# Patient Record
Sex: Female | Born: 1975 | Race: White | Hispanic: No | Marital: Married | Smoking: Never smoker
Health system: Southern US, Community
[De-identification: ages and names within clinical notes are randomized; demographics above are authoritative.]

---

## 2006-01-11 ENCOUNTER — Observation Stay: Payer: Self-pay | Admitting: Obstetrics and Gynecology

## 2006-01-11 ENCOUNTER — Inpatient Hospital Stay: Payer: Self-pay | Admitting: Obstetrics and Gynecology

## 2007-01-27 ENCOUNTER — Ambulatory Visit: Payer: Self-pay | Admitting: Internal Medicine

## 2007-03-05 ENCOUNTER — Ambulatory Visit: Payer: Self-pay | Admitting: Internal Medicine

## 2008-08-14 ENCOUNTER — Ambulatory Visit: Payer: Self-pay | Admitting: Family Medicine

## 2010-08-30 ENCOUNTER — Ambulatory Visit: Payer: Self-pay

## 2011-08-10 ENCOUNTER — Ambulatory Visit: Payer: Self-pay | Admitting: Medical

## 2015-11-09 ENCOUNTER — Other Ambulatory Visit: Payer: Self-pay | Admitting: Family Medicine

## 2015-11-09 DIAGNOSIS — Z1231 Encounter for screening mammogram for malignant neoplasm of breast: Secondary | ICD-10-CM

## 2015-11-11 ENCOUNTER — Other Ambulatory Visit: Payer: Self-pay | Admitting: Family Medicine

## 2015-11-11 ENCOUNTER — Ambulatory Visit
Admission: RE | Admit: 2015-11-11 | Discharge: 2015-11-11 | Disposition: A | Discharge status: Home / Self Care | Payer: BC Managed Care – PPO | Source: Ambulatory Visit | Attending: Family Medicine | Admitting: Family Medicine

## 2015-11-11 DIAGNOSIS — Z1231 Encounter for screening mammogram for malignant neoplasm of breast: Secondary | ICD-10-CM | POA: Insufficient documentation

## 2016-09-29 ENCOUNTER — Other Ambulatory Visit: Payer: Self-pay | Admitting: Family Medicine

## 2016-09-29 DIAGNOSIS — Z1231 Encounter for screening mammogram for malignant neoplasm of breast: Secondary | ICD-10-CM

## 2016-11-21 ENCOUNTER — Ambulatory Visit
Admission: RE | Admit: 2016-11-21 | Discharge: 2016-11-21 | Disposition: A | Discharge status: Home / Self Care | Payer: BC Managed Care – PPO | Source: Ambulatory Visit | Attending: Family Medicine | Admitting: Family Medicine

## 2016-11-21 DIAGNOSIS — R928 Other abnormal and inconclusive findings on diagnostic imaging of breast: Secondary | ICD-10-CM | POA: Diagnosis not present

## 2016-11-21 DIAGNOSIS — Z1231 Encounter for screening mammogram for malignant neoplasm of breast: Secondary | ICD-10-CM | POA: Insufficient documentation

## 2016-11-28 ENCOUNTER — Other Ambulatory Visit: Payer: Self-pay | Admitting: Family Medicine

## 2016-11-28 DIAGNOSIS — N6320 Unspecified lump in the left breast, unspecified quadrant: Secondary | ICD-10-CM

## 2016-11-28 DIAGNOSIS — R928 Other abnormal and inconclusive findings on diagnostic imaging of breast: Secondary | ICD-10-CM

## 2016-11-30 ENCOUNTER — Ambulatory Visit
Admission: RE | Admit: 2016-11-30 | Discharge: 2016-11-30 | Disposition: A | Discharge status: Home / Self Care | Payer: BC Managed Care – PPO | Source: Ambulatory Visit | Attending: Family Medicine | Admitting: Family Medicine

## 2016-11-30 DIAGNOSIS — N6489 Other specified disorders of breast: Secondary | ICD-10-CM | POA: Diagnosis not present

## 2016-11-30 DIAGNOSIS — R928 Other abnormal and inconclusive findings on diagnostic imaging of breast: Secondary | ICD-10-CM | POA: Insufficient documentation

## 2016-11-30 DIAGNOSIS — N6321 Unspecified lump in the left breast, upper outer quadrant: Secondary | ICD-10-CM | POA: Insufficient documentation

## 2016-11-30 DIAGNOSIS — N6320 Unspecified lump in the left breast, unspecified quadrant: Secondary | ICD-10-CM

## 2016-12-06 ENCOUNTER — Other Ambulatory Visit: Payer: Self-pay | Admitting: Family Medicine

## 2016-12-06 DIAGNOSIS — R928 Other abnormal and inconclusive findings on diagnostic imaging of breast: Secondary | ICD-10-CM

## 2016-12-07 ENCOUNTER — Ambulatory Visit: Payer: BC Managed Care – PPO

## 2016-12-07 ENCOUNTER — Other Ambulatory Visit: Payer: BC Managed Care – PPO

## 2017-06-04 ENCOUNTER — Ambulatory Visit
Admission: RE | Admit: 2017-06-04 | Discharge: 2017-06-04 | Disposition: A | Discharge status: Home / Self Care | Payer: BC Managed Care – PPO | Source: Ambulatory Visit | Attending: Family Medicine | Admitting: Family Medicine

## 2017-06-04 DIAGNOSIS — R928 Other abnormal and inconclusive findings on diagnostic imaging of breast: Secondary | ICD-10-CM | POA: Diagnosis not present

## 2017-06-05 ENCOUNTER — Other Ambulatory Visit: Payer: Self-pay | Admitting: Family Medicine

## 2017-06-05 DIAGNOSIS — N6489 Other specified disorders of breast: Secondary | ICD-10-CM

## 2017-06-05 DIAGNOSIS — R928 Other abnormal and inconclusive findings on diagnostic imaging of breast: Secondary | ICD-10-CM

## 2017-06-12 ENCOUNTER — Ambulatory Visit
Admission: RE | Admit: 2017-06-12 | Discharge: 2017-06-12 | Disposition: A | Discharge status: Home / Self Care | Payer: BC Managed Care – PPO | Source: Ambulatory Visit | Attending: Family Medicine | Admitting: Family Medicine

## 2017-06-12 DIAGNOSIS — R928 Other abnormal and inconclusive findings on diagnostic imaging of breast: Secondary | ICD-10-CM | POA: Insufficient documentation

## 2017-06-12 DIAGNOSIS — N6489 Other specified disorders of breast: Secondary | ICD-10-CM

## 2017-06-12 DIAGNOSIS — N6082 Other benign mammary dysplasias of left breast: Secondary | ICD-10-CM | POA: Insufficient documentation

## 2017-06-12 HISTORY — PX: BREAST BIOPSY: SHX20

## 2017-06-13 LAB — SURGICAL PATHOLOGY

## 2018-02-11 ENCOUNTER — Ambulatory Visit
Admission: EM | Admit: 2018-02-11 | Discharge: 2018-02-11 | Disposition: A | Discharge status: Home / Self Care | Payer: BC Managed Care – PPO | Attending: Family Medicine | Admitting: Family Medicine

## 2018-02-11 ENCOUNTER — Encounter: Payer: Self-pay | Admitting: Emergency Medicine

## 2018-02-11 ENCOUNTER — Other Ambulatory Visit: Payer: Self-pay

## 2018-02-11 DIAGNOSIS — R319 Hematuria, unspecified: Secondary | ICD-10-CM

## 2018-02-11 DIAGNOSIS — B9689 Other specified bacterial agents as the cause of diseases classified elsewhere: Secondary | ICD-10-CM

## 2018-02-11 DIAGNOSIS — N390 Urinary tract infection, site not specified: Secondary | ICD-10-CM | POA: Diagnosis not present

## 2018-02-11 LAB — URINALYSIS, COMPLETE (UACMP) WITH MICROSCOPIC
Bilirubin Urine: NEGATIVE
GLUCOSE, UA: NEGATIVE mg/dL
KETONES UR: NEGATIVE mg/dL
Nitrite: NEGATIVE
PROTEIN: 100 mg/dL — AB
Specific Gravity, Urine: 1.015 (ref 1.005–1.030)
pH: 8.5 — ABNORMAL HIGH (ref 5.0–8.0)

## 2018-02-11 MED ORDER — CEPHALEXIN 500 MG PO CAPS: 500.0000 mg | ORAL_CAPSULE | Freq: Two times a day (BID) | ORAL | 0 refills | Status: AC

## 2018-02-11 NOTE — Discharge Instructions (Addendum)
Take medication as prescribed. Rest. Drink plenty of fluids.  ° °Follow up with your primary care physician this week as needed. Return to Urgent care for new or worsening concerns.  ° °

## 2018-02-11 NOTE — ED Triage Notes (Signed)
Patient c/o urinary urgency, dysuria and frequency that started last night.

## 2018-02-11 NOTE — ED Provider Notes (Signed)
[facility] ____________________________________________  Time seen: Approximately 8:59 AM  I have reviewed the triage vital signs and the nursing notes.   HISTORY  Chief Complaint Urinary Urgency and Dysuria   HPI Cheryl Simmons is a 42 y.o. female feeling for evaluation of urinary frequency, urinary urgency and some burning with urination that started last night.  States symptoms worsened as the night progressed, including more frequent urination and onset of burning with urination.  States some lower abdominal pressure discomfort but denies any other abdominal pain.  Denies atypical back pain.  Denies accompanying fevers, cough, congestion, flank pain, chest pain, shortness of breath, vaginal discharge, vaginal pain.  Denies current pregnancy.  Did take over-the-counter ibuprofen and use a heating pad which helps some.  Denies other alleviating measures.  Denies aggravating factors.  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  Reports otherwise doing well denies other complaints.  No recent sickness.  White, Arlyss Repress, NP PCP   History reviewed. No pertinent past medical history. Denies  There are no active problems to display for this patient.   Past Surgical History:  Procedure Laterality Date  . BREAST BIOPSY Left 06/12/2017   path pending     No current facility-administered medications for this encounter.   Current Outpatient Medications:  .  cephALEXin (KEFLEX) 500 MG capsule, Take 1 capsule (500 mg total) by mouth 2 (two) times daily for 7 days., Disp: 14 capsule, Rfl: 0  Allergies Patient has no known allergies.  Family History  Problem Relation Age of Onset  . Breast cancer Paternal Grandmother 29    Social History Social History   Tobacco Use  . Smoking status: Never Smoker  . Smokeless tobacco: Never Used  Substance Use Topics  . Alcohol use: Yes    Comment: socially  .  Drug use: Never    Review of Systems Constitutional: No fever Cardiovascular: Denies chest pain. Respiratory: Denies shortness of breath. Gastrointestinal: No abdominal pain.  No nausea, no vomiting.  No diarrhea.   Genitourinary: positive for dysuria. Musculoskeletal: Negative for back pain. Skin: Negative for rash.   ____________________________________________   PHYSICAL EXAM:  VITAL SIGNS: ED Triage Vitals  Enc Vitals Group     BP 02/11/18 0811 98/65     Pulse Rate 02/11/18 0811 89     Resp 02/11/18 0811 18     Temp 02/11/18 0811 98.4 F (36.9 C)     Temp Source 02/11/18 0811 Oral     SpO2 02/11/18 0811 100 %     Weight 02/11/18 0809 120 lb (54.4 kg)     Height 02/11/18 0809 5\' 5"  (1.651 m)     Head Circumference --      Peak Flow --      Pain Score 02/11/18 0809 5     Pain Loc --      Pain Edu? --      Excl. in [place]? --     Constitutional: Alert and oriented. Well appearing and in no acute distress. ENT      Head: Normocephalic and atraumatic. Cardiovascular: Normal rate, regular rhythm. Grossly normal heart sounds.  Good peripheral circulation. Respiratory: Normal respiratory effort without tachypnea nor retractions. Breath sounds are clear and equal bilaterally. No wheezes, rales, rhonchi. Gastrointestinal: Minimal midline suprapubic tenderness.  Abdomen otherwise soft non-tender.  No CVA tenderness. Musculoskeletal: No midline cervical, thoracic or lumbar tenderness to palpation.  Neurologic:  Normal speech  and language. Speech is normal. No gait instability.  Skin:  Skin is warm, dry  Psychiatric: Mood and affect are normal. Speech and behavior are normal. Patient exhibits appropriate insight and judgment   ___________________________________________   LABS (all labs ordered are listed, but only abnormal results are displayed)  Labs Reviewed  URINALYSIS, COMPLETE (UACMP) WITH MICROSCOPIC - Abnormal; Notable for the following components:      Result Value    APPearance HAZY (*)    pH 8.5 (*)    Hgb urine dipstick LARGE (*)    Protein, ur 100 (*)    Leukocytes, UA MODERATE (*)    Bacteria, UA FEW (*)    All other components within normal limits    PROCEDURES Procedures    INITIAL IMPRESSION / ASSESSMENT AND PLAN / ED COURSE  Pertinent labs & imaging results that were available during my care of the patient were reviewed by me and considered in my medical decision making (see chart for details).  Well-appearing patient.  Urinalysis reviewed, suspect UTI.  Will treat with oral Keflex.  Encourage rest, fluids, supportive care.Discussed indication, risks and benefits of medications with patient.  Discussed follow up with Primary care physician this week. Discussed follow up and return parameters including no resolution or any worsening concerns. Patient verbalized understanding and agreed to plan.   ____________________________________________   FINAL CLINICAL IMPRESSION(S) / ED DIAGNOSES  Final diagnoses:  Urinary tract infection with hematuria, site unspecified     ED Discharge Orders         Ordered    cephALEXin (KEFLEX) 500 MG capsule  2 times daily     02/11/18 0837           Note: This dictation was prepared with Dragon dictation along with smaller phrase technology. Any transcriptional errors that result from this process are unintentional.         Renford Dills, NP 02/11/18 [phone]

## 2018-05-06 ENCOUNTER — Other Ambulatory Visit: Payer: Self-pay | Admitting: Family Medicine

## 2018-05-06 DIAGNOSIS — Z1231 Encounter for screening mammogram for malignant neoplasm of breast: Secondary | ICD-10-CM

## 2018-05-21 ENCOUNTER — Ambulatory Visit
Admission: RE | Admit: 2018-05-21 | Discharge: 2018-05-21 | Disposition: A | Discharge status: Home / Self Care | Payer: BC Managed Care – PPO | Source: Ambulatory Visit | Attending: Family Medicine | Admitting: Family Medicine

## 2018-05-21 DIAGNOSIS — Z1231 Encounter for screening mammogram for malignant neoplasm of breast: Secondary | ICD-10-CM | POA: Insufficient documentation

## 2018-10-06 ENCOUNTER — Ambulatory Visit
Admission: EM | Admit: 2018-10-06 | Discharge: 2018-10-06 | Disposition: A | Discharge status: Home / Self Care | Payer: BC Managed Care – PPO | Attending: Family Medicine | Admitting: Family Medicine

## 2018-10-06 ENCOUNTER — Other Ambulatory Visit: Payer: Self-pay

## 2018-10-06 DIAGNOSIS — R319 Hematuria, unspecified: Secondary | ICD-10-CM

## 2018-10-06 DIAGNOSIS — N390 Urinary tract infection, site not specified: Secondary | ICD-10-CM

## 2018-10-06 DIAGNOSIS — R350 Frequency of micturition: Secondary | ICD-10-CM | POA: Diagnosis not present

## 2018-10-06 LAB — URINALYSIS, COMPLETE (UACMP) WITH MICROSCOPIC
Bilirubin Urine: NEGATIVE
Glucose, UA: NEGATIVE mg/dL
Ketones, ur: NEGATIVE mg/dL
Nitrite: NEGATIVE
Protein, ur: NEGATIVE mg/dL
Specific Gravity, Urine: 1.015 (ref 1.005–1.030)
WBC, UA: >50 WBC/hpf (ref 0–5)
pH: 7 (ref 5.0–8.0)

## 2018-10-06 MED ORDER — CEPHALEXIN 500 MG PO CAPS: 500.0000 mg | ORAL_CAPSULE | Freq: Two times a day (BID) | ORAL | 0 refills | Status: AC

## 2018-10-06 NOTE — ED Provider Notes (Signed)
[facility] ____________________________________________  Time seen: Approximately 1:34 PM  I have reviewed the triage vital signs and the nursing notes.   HISTORY  Chief Complaint Urinary Tract Infection (appt)   HPI Cheryl Simmons is a 43 y.o. female presenting for evaluation of urinary frequency, urinary urgency and burning with urination since last night.  Also noticed some blood in her urine.  Similar to previous UTI.  Suspects related to not drinking as much water recently as well as recent sexual intercourse.  Denies eat and drink well.  Denies recent cough, chest pain or shortness of breath or fevers.  Denies aggravating or alleviating factors.  No recent antibiotic use.  Patient's last menstrual period was 09/17/2018 (exact date).   History reviewed. No pertinent past medical history.  There are no active problems to display for this patient.   Past Surgical History:  Procedure Laterality Date  . BREAST BIOPSY Left 06/12/2017   bx/clip-neg     No current facility-administered medications for this encounter.   Current Outpatient Medications:  .  cephALEXin (KEFLEX) 500 MG capsule, Take 1 capsule (500 mg total) by mouth 2 (two) times daily for 7 days., Disp: 14 capsule, Rfl: 0  Allergies Patient has no known allergies.  Family History  Problem Relation Age of Onset  . Breast cancer Paternal Grandmother 89    Social History Social History   Tobacco Use  . Smoking status: Never Smoker  . Smokeless tobacco: Never Used  Substance Use Topics  . Alcohol use: Yes    Comment: socially  . Drug use: Never    Review of Systems Constitutional: No fever ENT: No sore throat. Cardiovascular: Denies chest pain. Respiratory: Denies shortness of breath. Gastrointestinal: No abdominal pain.  No nausea, no vomiting.  No diarrhea.   Genitourinary: positive for dysuria. Musculoskeletal: Negative for back pain. Skin: Negative for rash.    ____________________________________________   PHYSICAL EXAM:  VITAL SIGNS: ED Triage Vitals  Enc Vitals Group     BP 10/06/18 1221 102/76     Pulse Rate 10/06/18 1221 81     Resp 10/06/18 1221 18     Temp 10/06/18 1221 98.7 F (37.1 C)     Temp Source 10/06/18 1221 Oral     SpO2 10/06/18 1221 100 %     Weight 10/06/18 1222 125 lb (56.7 kg)     Height 10/06/18 1222 5\' 5"  (1.651 m)     Head Circumference --      Peak Flow --      Pain Score 10/06/18 1222 0     Pain Loc --      Pain Edu? --      Excl. in [place]? --     Constitutional: Alert and oriented. Well appearing and in no acute distress. ENT      Head: Normocephalic and atraumatic. Cardiovascular: Normal rate, regular rhythm. Grossly normal heart sounds.  Good peripheral circulation. Respiratory: Normal respiratory effort without tachypnea nor retractions. Breath sounds are clear and equal bilaterally. No wheezes, rales, rhonchi. Gastrointestinal: Soft and nontender. No CVA tenderness. Musculoskeletal: No midline cervical, thoracic or lumbar tenderness to palpation. Neurologic:  Normal speech and language.Speech is normal. No gait instability.  Skin:  Skin is warm, dry and intact. No rash noted. Psychiatric: Mood and affect are normal. Speech and behavior are normal. Patient exhibits appropriate insight and judgment   ___________________________________________   LABS (all labs ordered are listed, but only abnormal results are displayed)  Labs Reviewed  URINALYSIS,  COMPLETE (UACMP) WITH MICROSCOPIC - Abnormal; Notable for the following components:      Result Value   APPearance CLOUDY (*)    Hgb urine dipstick LARGE (*)    Leukocytes,Ua LARGE (*)    Bacteria, UA MANY (*)    All other components within normal limits  URINE CULTURE   ____________________________________________   PROCEDURES Procedures   INITIAL IMPRESSION / ASSESSMENT AND PLAN / ED COURSE  Pertinent labs & imaging results that were available  during my care of the patient were reviewed by me and considered in my medical decision making (see chart for details).  Well-appearing patient.  No acute distress.  Urinalysis reviewed, suspect UTI.  We will culture.  Will treat with oral Keflex.  Encourage rest, fluids, supportive care.Discussed indication, risks and benefits of medications with patient.  Discussed follow up with Primary care physician this week. Discussed follow up and return parameters including no resolution or any worsening concerns. Patient verbalized understanding and agreed to plan.   ____________________________________________   FINAL CLINICAL IMPRESSION(S) / ED DIAGNOSES  Final diagnoses:  Urinary tract infection with hematuria, site unspecified     ED Discharge Orders         Ordered    cephALEXin (KEFLEX) 500 MG capsule  2 times daily     10/06/18 1257           Note: This dictation was prepared with Dragon dictation along with smaller phrase technology. Any transcriptional errors that result from this process are unintentional.         Renford DillsMiller, Raja Caputi, NP 10/06/18 1341

## 2018-10-06 NOTE — ED Triage Notes (Signed)
Pt here for UTI with dysuria, frequency and some blood noticed. Started last night. Did take ibuprofen last night.

## 2018-10-06 NOTE — Discharge Instructions (Addendum)
Take medication as prescribed. Rest. Drink plenty of fluids.  ° °Follow up with your primary care physician this week as needed. Return to Urgent care for new or worsening concerns.  ° °

## 2018-10-08 LAB — URINE CULTURE: Culture: >=100000 — AB

## 2018-10-11 ENCOUNTER — Telehealth (HOSPITAL_COMMUNITY): Payer: Self-pay | Admitting: Emergency Medicine

## 2018-10-11 NOTE — Telephone Encounter (Signed)
Urine culture was positive for e coli and was given keflex  at urgent care visit. Attempted to reach patient. No answer at this time.   

## 2019-03-09 IMAGING — MG MM BREAST BX W LOC DEV 1ST LESION IMAGE BX SPEC STEREO GUIDE*L*
8 of 10 series · 8 of 18 positions shown · non-contrast
Comparison: Previous exams.

ADDENDUM:
Pathology of the left breast biopsy revealed BENIGN BREAST TISSUE
WITH AREAS OF PSEUDOANGIOMATOUS STROMAL HYPERPLASIA (PASH). NEGATIVE
FOR ATYPIA AND MALIGNANCY. Comment: The cores contain breast tissue
with a high proportion of epithelial structures and stroma relative
to adipose tissue, likely corresponding to mammographically dense
tissue. There are no epithelial proliferative changes in this
sample. Correlation with all available imaging studies is advised.

This was found to be concordant with Dr. Harshaka impression and
notes.
Recommendations: Routine bilateral screening mammogram in one year.
At the patient's request, results and recommendations were relayed
to the patient by phone by Jumper, [place] on 06/13/17. The patient
stated she did well following the biopsy with no bleeding, bruising,
or hematoma. Post biopsy instructions were reviewed with the patient
and all of her questions were answered. She was encouraged to
contact the [HOSPITAL] with any further questions or
concerns.
Addendum by Jumper, [place] on 06/13/17.
CLINICAL DATA: Patient presents for stereotactic core needle biopsy
of an area of asymmetry in the left breast.
EXAM:
LEFT BREAST STEREOTACTIC CORE NEEDLE BIOPSY

[L (1 of 7)]
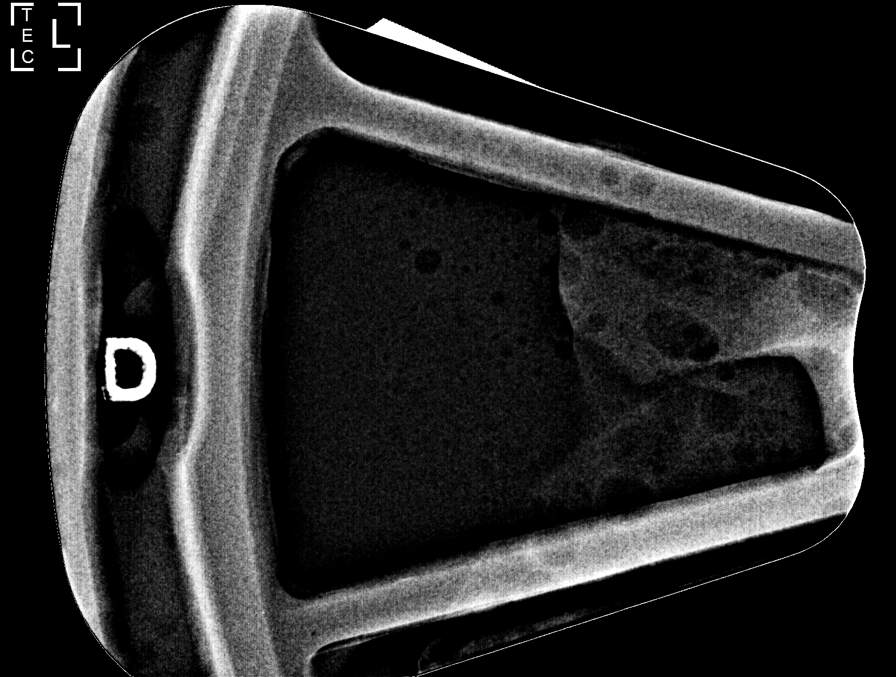

[L (2 of 7)]
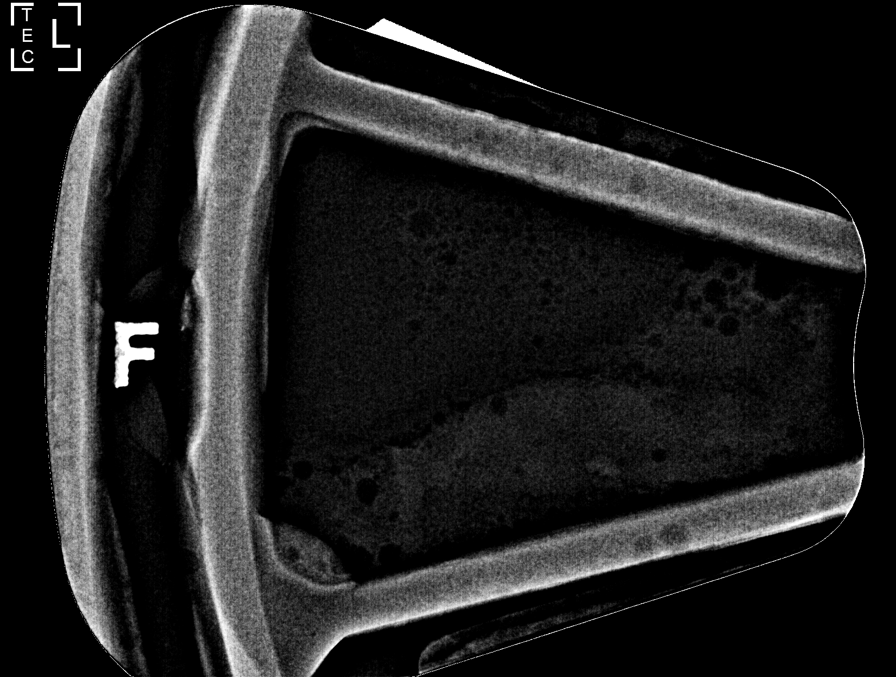

[L (3 of 7)]
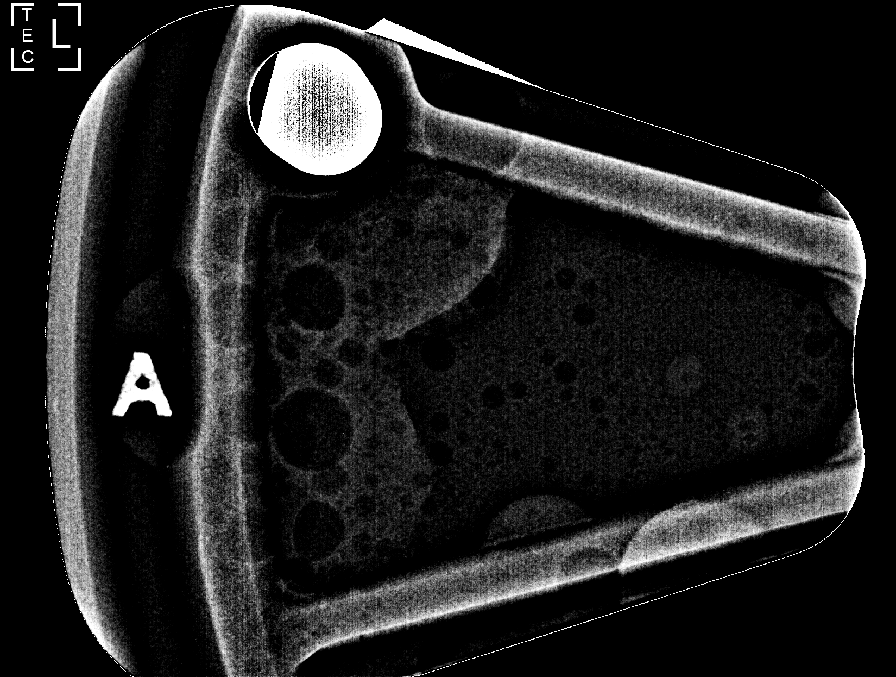

[L (4 of 7)]
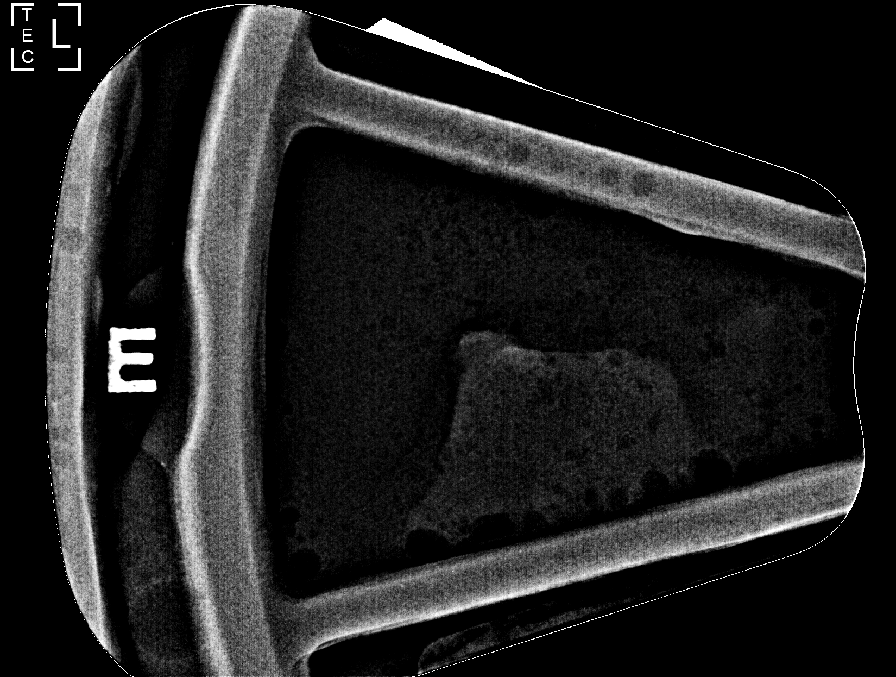

[L (5 of 7)]
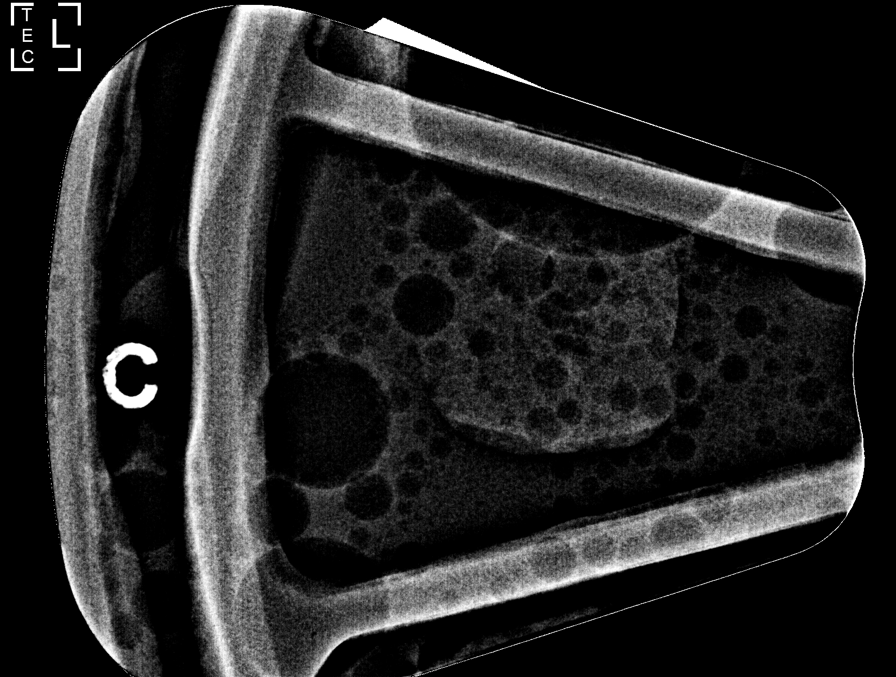

[L (6 of 7)]
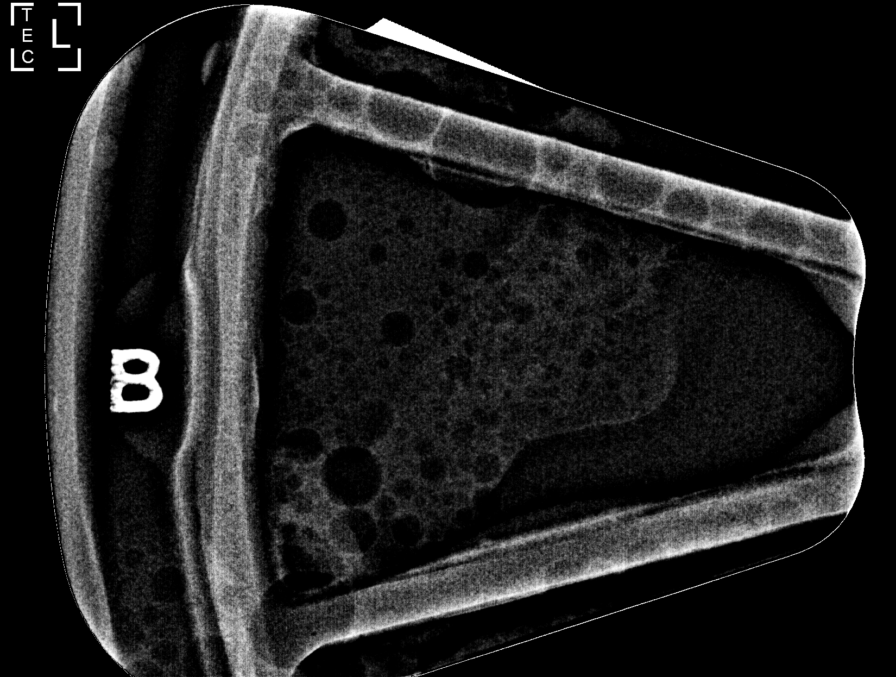

[L (7 of 7)]
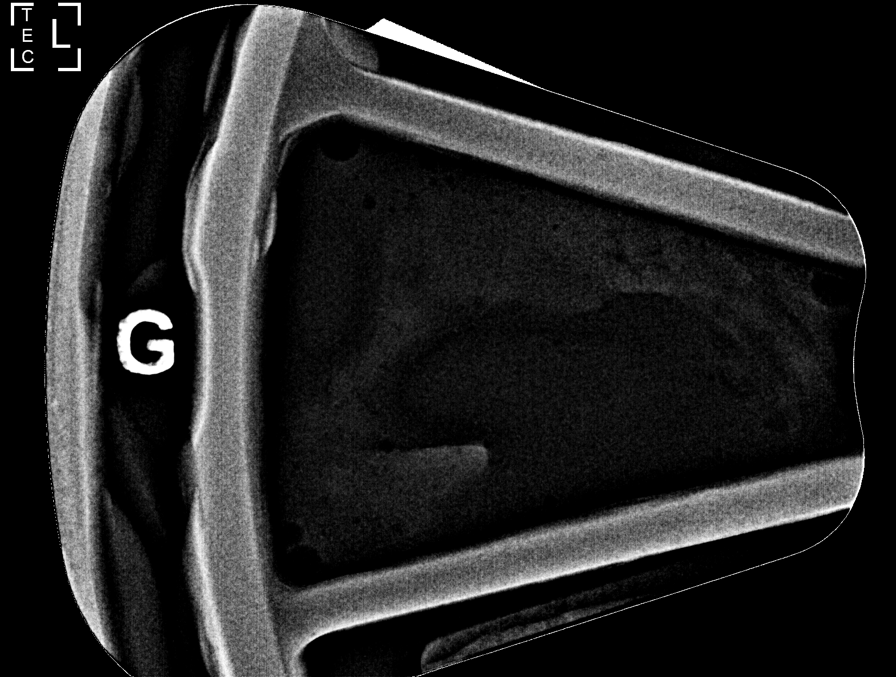

[L CC]
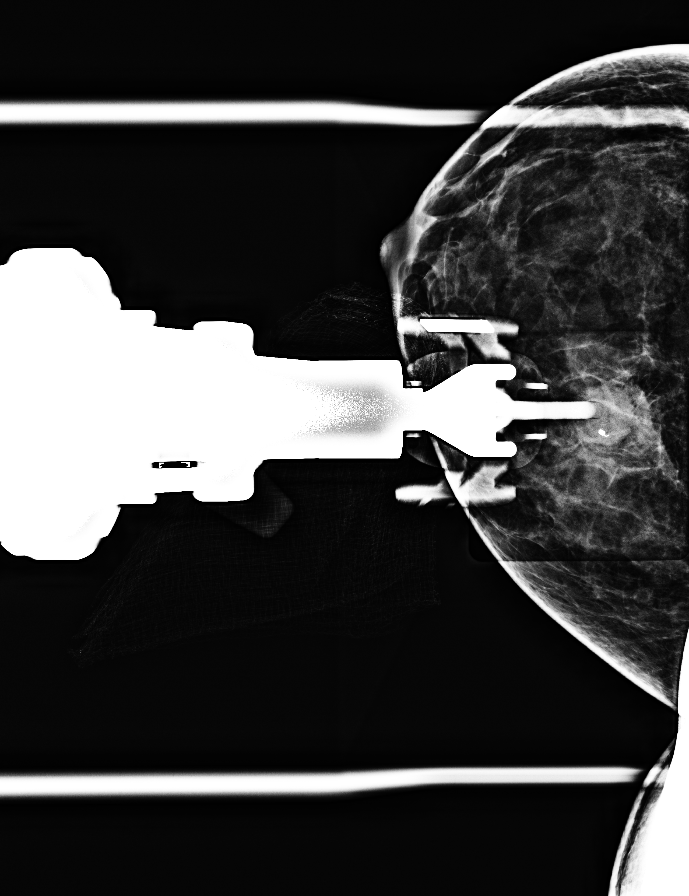

[8 of 18 positions shown; findings below may reference images not displayed]



Using sterile technique and 1% Lidocaine as local anesthetic, under
stereotactic guidance, a 9 gauge vacuum assisted device was used to
perform core needle biopsy of the upper-outer quadrant asymmetry
using a superior approach.

Lesion quadrant: Upper outer quadrant

At the conclusion of the procedure, a coil shaped tissue marker clip
was deployed into the biopsy cavity. Follow-up 2-view mammogram was
performed and dictated separately.
IMPRESSION: Stereotactic-guided biopsy of a left breast asymmetry. No apparent
complications.

## 2019-03-09 IMAGING — MG MM BREAST LOCALIZATION CLIP
2 series · 2 of 2 positions shown · non-contrast
Comparison: Previous exam(s).

CLINICAL DATA: Status post stereotactic core needle biopsy of an
area of asymmetry in the left breast.

EXAM:
DIAGNOSTIC LEFT MAMMOGRAM POST STEREOTACTIC BIOPSY

[L CC]
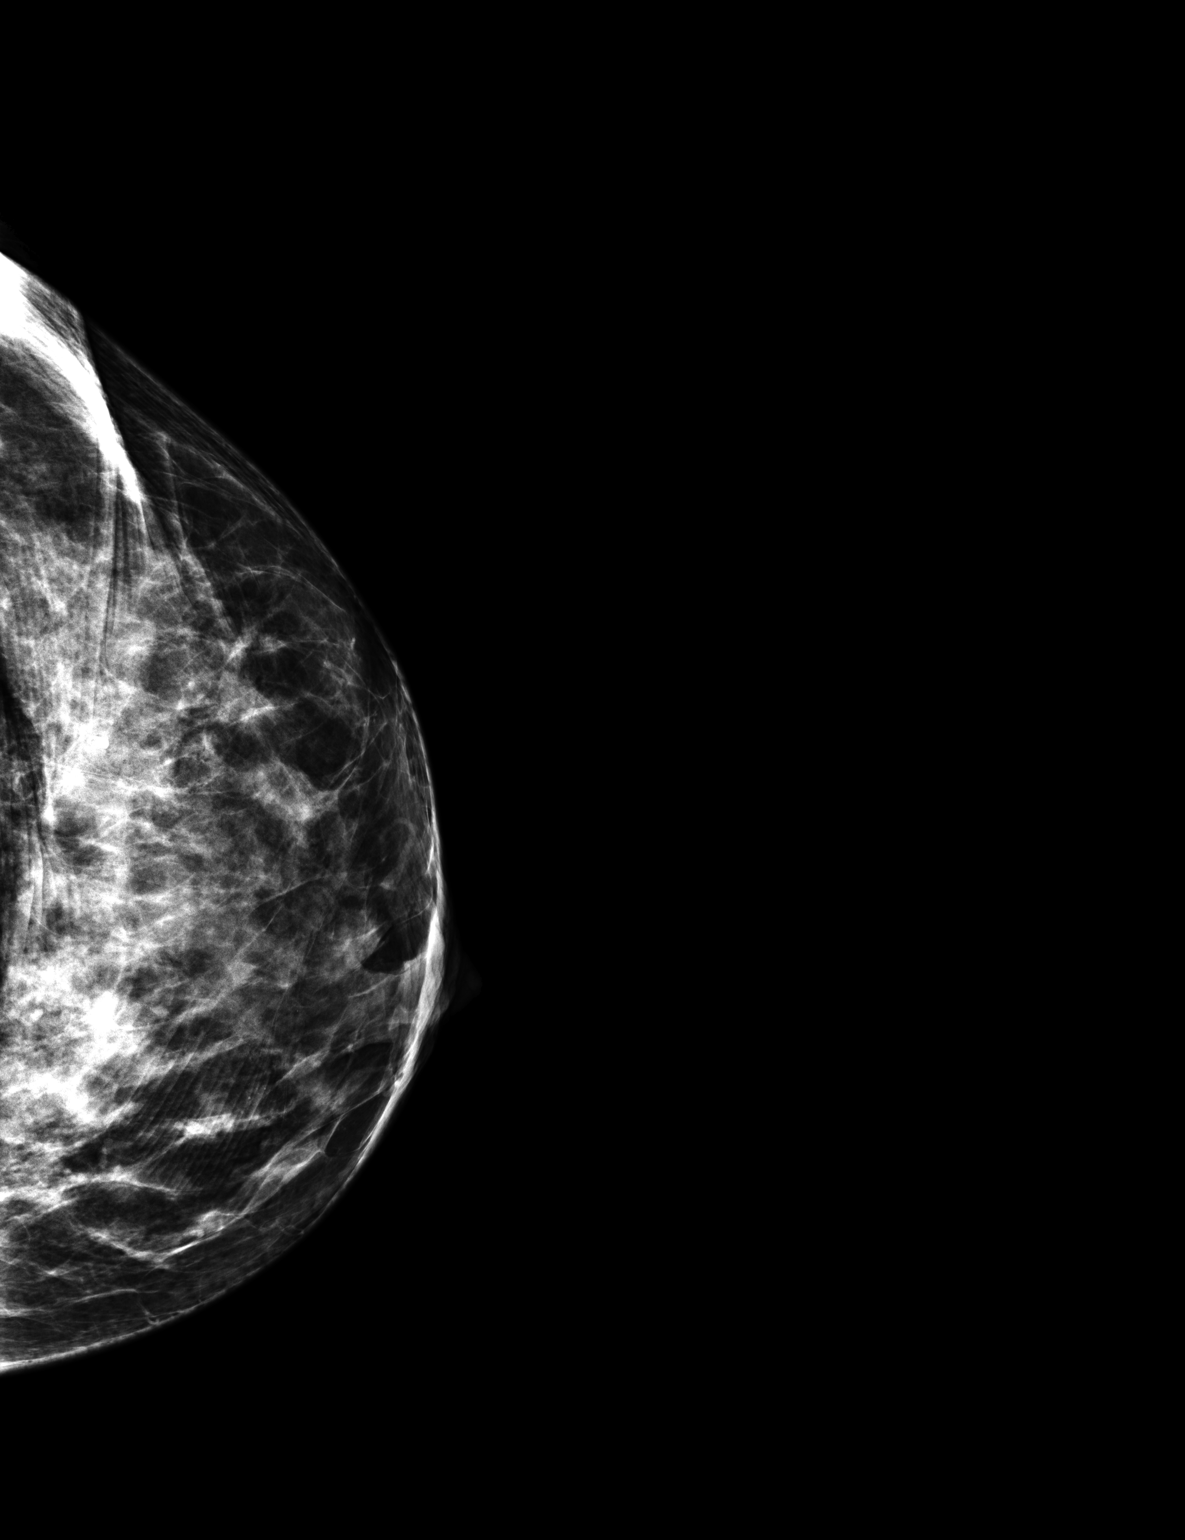

[L ML]
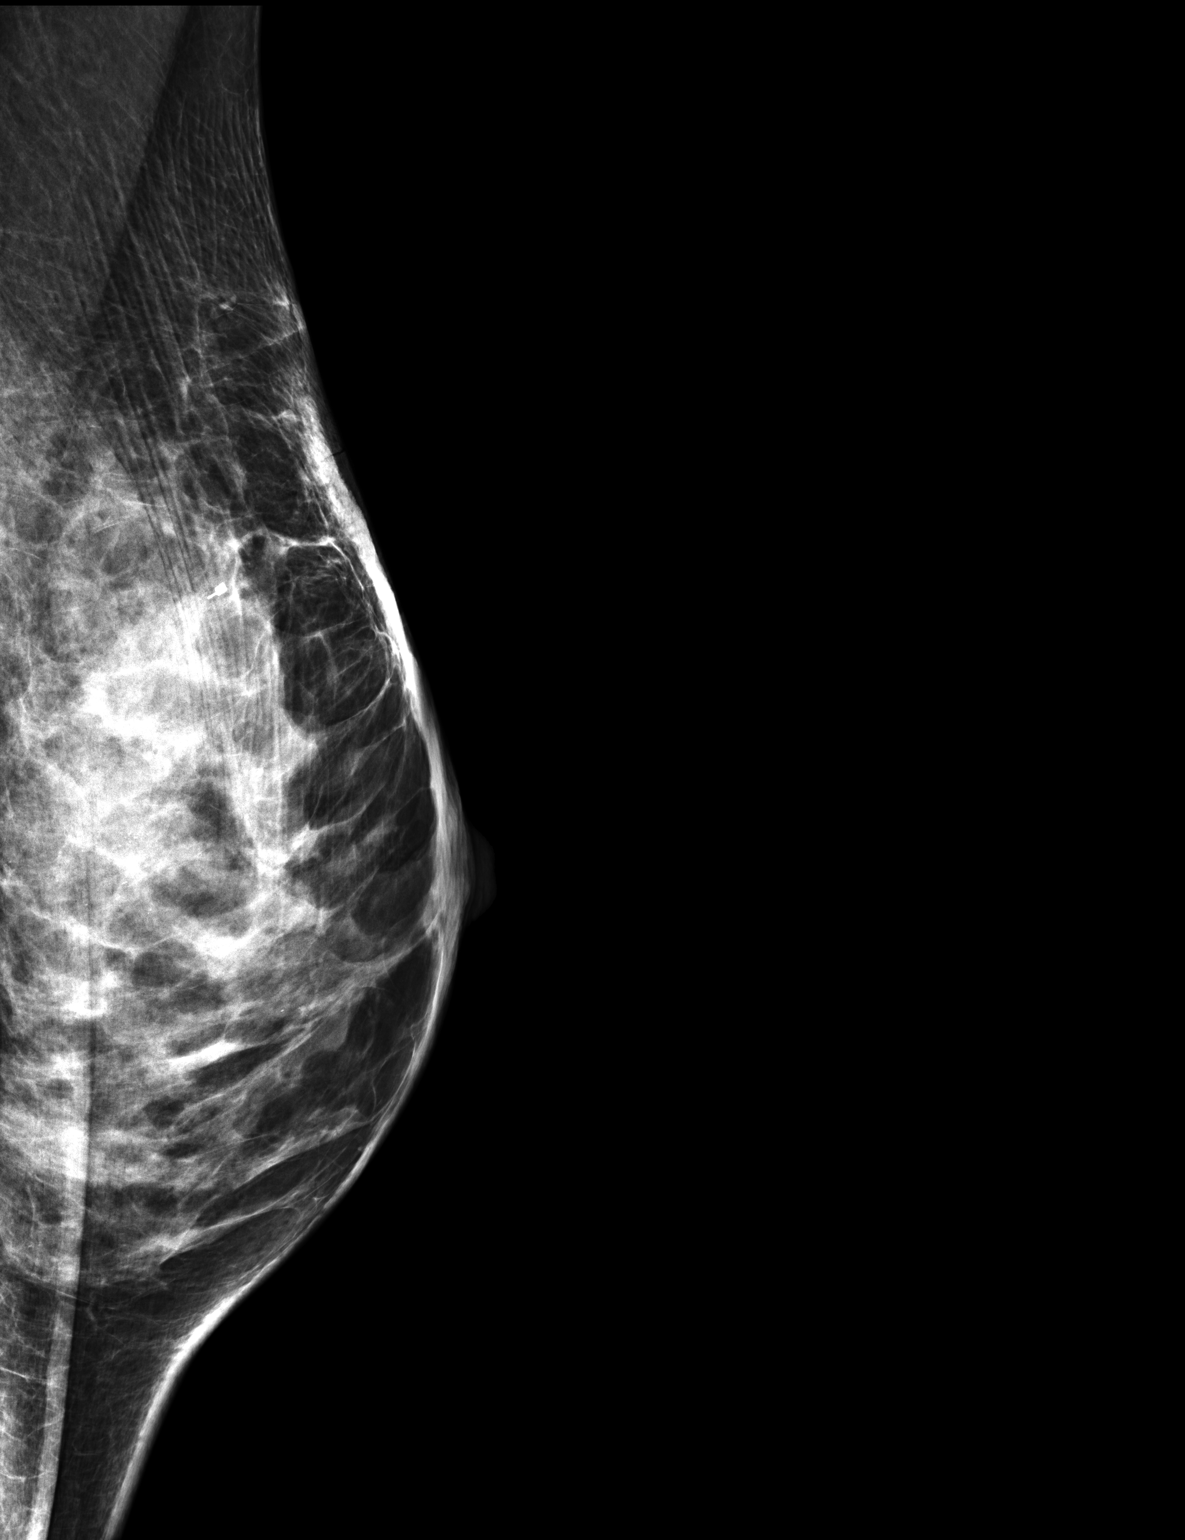

[2 of 2 positions shown; findings below may reference images not displayed]

FINDINGS: Mammographic images were obtained following stereotactic guided
biopsy of a left breast asymmetry. The coil shaped biopsy clip lies
in the upper outer left breast in the area of the asymmetry.
IMPRESSION: Well-positioned coil shaped biopsy clip following stereotactic core
needle biopsy of a left breast asymmetry.

Final Assessment: Post Procedure Mammograms for Marker Placement

## 2019-04-21 ENCOUNTER — Other Ambulatory Visit: Payer: Self-pay | Admitting: Family Medicine

## 2019-05-12 ENCOUNTER — Other Ambulatory Visit: Payer: Self-pay | Admitting: Medical Oncology

## 2019-05-12 DIAGNOSIS — Z1231 Encounter for screening mammogram for malignant neoplasm of breast: Secondary | ICD-10-CM

## 2019-05-27 ENCOUNTER — Ambulatory Visit
Admission: RE | Admit: 2019-05-27 | Discharge: 2019-05-27 | Disposition: A | Discharge status: Home / Self Care | Payer: BC Managed Care – PPO | Source: Ambulatory Visit | Attending: Medical Oncology | Admitting: Medical Oncology

## 2019-05-27 ENCOUNTER — Other Ambulatory Visit: Payer: Self-pay

## 2019-05-27 ENCOUNTER — Encounter (INDEPENDENT_AMBULATORY_CARE_PROVIDER_SITE_OTHER): Payer: Self-pay

## 2019-05-27 DIAGNOSIS — Z1231 Encounter for screening mammogram for malignant neoplasm of breast: Secondary | ICD-10-CM | POA: Diagnosis not present

## 2019-05-29 ENCOUNTER — Other Ambulatory Visit: Payer: Self-pay | Admitting: Medical Oncology

## 2019-05-29 DIAGNOSIS — R928 Other abnormal and inconclusive findings on diagnostic imaging of breast: Secondary | ICD-10-CM

## 2019-05-29 DIAGNOSIS — N6489 Other specified disorders of breast: Secondary | ICD-10-CM

## 2019-06-05 ENCOUNTER — Ambulatory Visit
Admission: RE | Admit: 2019-06-05 | Discharge: 2019-06-05 | Disposition: A | Discharge status: Home / Self Care | Payer: BC Managed Care – PPO | Source: Ambulatory Visit | Attending: Medical Oncology | Admitting: Medical Oncology

## 2019-06-05 DIAGNOSIS — N6489 Other specified disorders of breast: Secondary | ICD-10-CM | POA: Insufficient documentation

## 2019-06-05 DIAGNOSIS — R928 Other abnormal and inconclusive findings on diagnostic imaging of breast: Secondary | ICD-10-CM | POA: Diagnosis not present

## 2020-05-22 ENCOUNTER — Ambulatory Visit: Payer: Self-pay

## 2020-06-09 ENCOUNTER — Other Ambulatory Visit: Payer: Self-pay | Admitting: Family Medicine

## 2020-06-09 DIAGNOSIS — Z1231 Encounter for screening mammogram for malignant neoplasm of breast: Secondary | ICD-10-CM

## 2020-06-22 ENCOUNTER — Ambulatory Visit
Admission: RE | Admit: 2020-06-22 | Discharge: 2020-06-22 | Disposition: A | Discharge status: Home / Self Care | Payer: BC Managed Care – PPO | Source: Ambulatory Visit | Attending: Family Medicine | Admitting: Family Medicine

## 2020-06-22 ENCOUNTER — Other Ambulatory Visit: Payer: Self-pay

## 2020-06-22 DIAGNOSIS — Z1231 Encounter for screening mammogram for malignant neoplasm of breast: Secondary | ICD-10-CM | POA: Insufficient documentation

## 2020-10-20 ENCOUNTER — Other Ambulatory Visit: Payer: Self-pay

## 2020-10-20 ENCOUNTER — Ambulatory Visit
Admission: RE | Admit: 2020-10-20 | Discharge: 2020-10-20 | Disposition: A | Discharge status: Home / Self Care | Payer: BC Managed Care – PPO | Source: Ambulatory Visit | Attending: Emergency Medicine | Admitting: Emergency Medicine

## 2020-10-20 VITALS — BP 101/50 | HR 64 | Temp 98.4°F | Resp 18 | Wt 130.0 lb

## 2020-10-20 DIAGNOSIS — H10503 Unspecified blepharoconjunctivitis, bilateral: Secondary | ICD-10-CM

## 2020-10-20 MED ORDER — TOBRAMYCIN-DEXAMETHASONE 0.3-0.1 % OP SUSP: 2.0000 [drp] | Freq: Four times a day (QID) | OPHTHALMIC | 0 refills | Status: DC

## 2020-10-20 NOTE — ED Provider Notes (Signed)
[facility]    CSN: [id number] Arrival date & time: 10/20/20  0240      History   Chief Complaint Chief Complaint  Patient presents with   eye irritation    HPI Cheryl Simmons is a 45 y.o. female.   HPI  45 year old female here for evaluation of eye complaints.  Patient reports that she developed irritation in both of her eyes yesterday that is worsened today.  She states that both eyes burn, there is tenderness to the upper eyelid on the lateral side all of her left eye and her right eye is red and draining a thick yellow mucoid drainage.  She reports that her eye was matted shut this morning and she had cleaned with a wet washcloth.  She denies any changes in vision.  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  Patient also reports that her daughter recently had an upper respiratory issue but denies any known conjunctivitis complaints.  History reviewed. No pertinent past medical history.  There are no problems to display for this patient.   Past Surgical History:  Procedure Laterality Date   BREAST BIOPSY Left 06/12/2017   bx/clip-neg    OB History   No obstetric history on file.      Home Medications    Prior to Admission medications   Medication Sig Start Date End Date Taking? Authorizing Provider  tobramycin-dexamethasone [facility]) ophthalmic solution Place 2 drops into the right eye every 6 (six) hours. 10/20/20  Yes Becky Augusta, NP    Family History Family History  Problem Relation Age of Onset   Breast cancer Paternal Grandmother 19    Social History Social History   Tobacco Use   Smoking status: Never   Smokeless tobacco: Never  Vaping Use   Vaping Use: Never used  Substance Use Topics   Alcohol use: Yes    Comment: socially   Drug use: Never     Allergies   Hydrocortisone acetate   Review of Systems Review of Systems  Eyes:  Positive for  pain, discharge and redness. Negative for photophobia, itching and visual disturbance.    Physical Exam Triage Vital Signs ED Triage Vitals  Enc Vitals Group     BP 10/20/20 0952 (!) 101/50     Pulse Rate 10/20/20 0952 64     Resp 10/20/20 0952 18     Temp 10/20/20 0952 98.4 F (36.9 C)     Temp Source 10/20/20 0952 Oral     SpO2 10/20/20 0952 100 %     Weight 10/20/20 0951 130 lb (59 kg)     Height --      Head Circumference --      Peak Flow --      Pain Score 10/20/20 0951 2     Pain Loc --      Pain Edu? --      Excl. in [place]? --    No data found.  Updated Vital Signs BP (!) 101/50 (BP Location: Left Arm)   Pulse 64   Temp 98.4 F (36.9 C) (Oral)   Resp 18   Wt 130 lb (59 kg)   LMP 09/29/2020   SpO2 100%   BMI 21.63 kg/m   Visual Acuity Right Eye Distance: 20/20 Left Eye Distance: 20/20 Bilateral Distance: 20/20  Right Eye Near:   Left Eye Near:    Bilateral Near:  Physical Exam Vitals and nursing note reviewed.  Constitutional:      General: She is not in acute distress.    Appearance: Normal appearance. She is normal weight. She is not ill-appearing.  HENT:     Head: Normocephalic and atraumatic.  Eyes:     General: No scleral icterus.       Right eye: Discharge present.     Extraocular Movements: Extraocular movements intact.     Pupils: Pupils are equal, round, and reactive to light.  Skin:    General: Skin is warm and dry.     Capillary Refill: Capillary refill takes less than 2 seconds.  Neurological:     General: No focal deficit present.     Mental Status: She is alert and oriented to person, place, and time.  Psychiatric:        Mood and Affect: Mood normal.        Behavior: Behavior normal.        Thought Content: Thought content normal.        Judgment: Judgment normal.     UC Treatments / Results  Labs (all labs ordered are listed, but only abnormal results are displayed) Labs Reviewed - No data to  display  EKG   Radiology No results found.  Procedures Procedures (including critical care time)  Medications Ordered in UC Medications - No data to display  Initial Impression / Assessment and Plan / UC Course  I have reviewed the triage vital signs and the nursing notes.  Pertinent labs & imaging results that were available during my care of the patient were reviewed by me and considered in my medical decision making (see chart for details).  Patient is a very pleasant, nontoxic-appearing 46 year old female here for evaluation of bilateral eye complaints with her right being worse than her left.  Patient's physical exam reveals an erythematous right eye with bulbar and pleural injection of conjunctiva.  There is thick yellow mucoid discharge in the inner canthus of the left eye and some crusty yellow discharge in the upper lashes.  Patient's left eye is free of redness or discharge.  Both upper eyelids are mildly edematous without erythema or tenderness.  Patient does have tenderness at the lateral canthus of her left eye.  EOM is intact and pupils are equal round reactive.  Patient exam is consistent with conjunctivitis and will treat both eyes with TobraDex, 2 drops 4 times a day for the next 7 days.  Patient advised that if she develops any changes in her vision, increased pain in her eyes, or light sensitive and that she needs to be evaluated by ophthalmology in the ER.   Final Clinical Impressions(s) / UC Diagnoses   Final diagnoses:  Blepharoconjunctivitis of both eyes, unspecified blepharoconjunctivitis type     Discharge Instructions      Instill 2 drops of Tobradex in each eye every 6 hours for the next 7 days for treatment of your conjunctivitis.  Avoid touching your eyes as much as possible.  Wipe down all surfaces, countertops, and doorknobs after the first and second 24 hours on eyedrops.  Wash her face with a clean wash rag to remove any drainage and use a  different portion of the wash rag to clean each eye so as to not reinfect yourself.  Return for reevaluation for any new or worsening symptoms.      ED Prescriptions     Medication Sig Dispense Auth. Provider   tobramycin-dexamethasone [facility]) ophthalmic solution Place  2 drops into the right eye every 6 (six) hours. 5 mL Becky Augusta, NP      PDMP not reviewed this encounter.   Becky Augusta, NP 10/20/20 1032

## 2020-10-20 NOTE — ED Triage Notes (Signed)
Pt c/o right eye irritation as of yesterday and states it has worsened and now has some left eye issues

## 2020-10-20 NOTE — Discharge Instructions (Signed)
Instill 2 drops of Tobradex in each eye every 6 hours for the next 7 days for treatment of your conjunctivitis.  Avoid touching your eyes as much as possible.  Wipe down all surfaces, countertops, and doorknobs after the first and second 24 hours on eyedrops.  Wash her face with a clean wash rag to remove any drainage and use a different portion of the wash rag to clean each eye so as to not reinfect yourself.  Return for reevaluation for any new or worsening symptoms.

## 2020-10-24 ENCOUNTER — Ambulatory Visit
Admission: RE | Admit: 2020-10-24 | Discharge: 2020-10-24 | Disposition: A | Discharge status: Home / Self Care | Payer: BC Managed Care – PPO | Source: Ambulatory Visit | Attending: Physician Assistant | Admitting: Physician Assistant

## 2020-10-24 ENCOUNTER — Other Ambulatory Visit: Payer: Self-pay

## 2020-10-24 VITALS — BP 127/90 | HR 110 | Temp 99.1°F | Resp 14 | Ht 65.0 in | Wt 130.0 lb

## 2020-10-24 DIAGNOSIS — J029 Acute pharyngitis, unspecified: Secondary | ICD-10-CM | POA: Insufficient documentation

## 2020-10-24 DIAGNOSIS — H1033 Unspecified acute conjunctivitis, bilateral: Secondary | ICD-10-CM | POA: Insufficient documentation

## 2020-10-24 DIAGNOSIS — J039 Acute tonsillitis, unspecified: Secondary | ICD-10-CM

## 2020-10-24 LAB — POCT RAPID STREP A: Streptococcus, Group A Screen (Direct): NEGATIVE

## 2020-10-24 MED ORDER — PREDNISONE 20 MG PO TABS: 40.0000 mg | ORAL_TABLET | Freq: Every day | ORAL | 0 refills | Status: AC

## 2020-10-24 MED ORDER — CIPROFLOXACIN HCL 0.3 % OP SOLN: OPHTHALMIC | 0 refills | Status: DC

## 2020-10-24 MED ORDER — LIDOCAINE VISCOUS HCL 2 % MT SOLN: 15.0000 mL | OROMUCOSAL | 0 refills | Status: AC | PRN

## 2020-10-24 MED ORDER — AMOXICILLIN-POT CLAVULANATE 875-125 MG PO TABS: 1.0000 | ORAL_TABLET | Freq: Two times a day (BID) | ORAL | 0 refills | Status: AC

## 2020-10-24 NOTE — Discharge Instructions (Addendum)
Your strep test is negative but I have low confidence in that result given the way that your throat looks.  Will be sent for culture but I am going to go ahead and treat with Augmentin.  Additionally you have conjunctivitis that has not improved with the eyedrops that you have been on previously so I have sent a new 1.  I have also sent prednisone to help with the swelling of your eye and your tonsils.  Additionally I sent viscous lidocaine to help numb your throat.  Increase rest and fluids.  You can take Tylenol for pain if needed.  Please follow-up PCP if not improving or return here.  Please go to ED for any severe acute worsening of any of your symptoms.

## 2020-10-24 NOTE — ED Triage Notes (Addendum)
Patient c/o sinus congestion and pressure, headache, and scratchy throat that started on Thursday.  Patient states that she was on 10/20/20 for eye infection. Patient denies fevers.  Patient states that she had COVID in may 2022

## 2020-10-24 NOTE — ED Provider Notes (Signed)
[facility]    CSN: [id number] Arrival date & time: 10/24/20  1449      History   Chief Complaint Chief Complaint  Patient presents with   Sinus Problem   Sore Throat    HPI Cheryl Simmons is a 45 y.o. female presenting for 4-day history of bilateral eye redness, pain and yellowish drainage.  She was seen at [facility] urgent care 4 days ago and treated with TobraDex.  She says she has used these drops occasionally and they did not seem to help.  Over the past few days she has developed other symptoms including sore and scratchy throat with whitish exudates.  Also admits to sinus congestion and headache.  She says she is felt feverish but not recorded temperature.  Admits to chills and fatigue.  She says her daughter had a sore throat and fever a few weeks ago but no one has been sick recently.  No recent COVID exposure and she admits to personal history of COVID-19 2 months ago.  Patient has been taking over-the-counter ibuprofen for symptoms but no other medications other than the drops that were prescribed a few days ago.  Patient states that she feels worse now than when she had COVID a couple months ago.  She has no other complaints or concerns.  HPI  History reviewed. No pertinent past medical history.  There are no problems to display for this patient.   Past Surgical History:  Procedure Laterality Date   BREAST BIOPSY Left 06/12/2017   bx/clip-neg    OB History   No obstetric history on file.      Home Medications    Prior to Admission medications   Medication Sig Start Date End Date Taking? Authorizing Provider  amoxicillin-clavulanate (AUGMENTIN) 875-125 MG tablet Take 1 tablet by mouth every 12 (twelve) hours for 10 days. 10/24/20 11/03/20 Yes Shirlee Latch, PA-C  ciprofloxacin (CILOXAN) 0.3 % ophthalmic solution Administer 1 drop, every 2 hours, while awake, for 2 days. Then 1 drop, every 4 hours, while awake, for the next 5 days. 10/24/20  Yes  Eusebio Friendly B, PA-C  lidocaine (XYLOCAINE) 2 % solution Use as directed 15 mLs in the mouth or throat every 3 (three) hours as needed for mouth pain (swish and spit). 10/24/20  Yes Shirlee Latch, PA-C  predniSONE (DELTASONE) 20 MG tablet Take 2 tablets (40 mg total) by mouth daily for 5 days. 10/24/20 10/29/20 Yes Shirlee Latch, PA-C    Family History Family History  Problem Relation Age of Onset   Breast cancer Paternal Grandmother 39    Social History Social History   Tobacco Use   Smoking status: Never   Smokeless tobacco: Never  Vaping Use   Vaping Use: Never used  Substance Use Topics   Alcohol use: Yes    Comment: socially   Drug use: Never     Allergies   Hydrocortisone acetate   Review of Systems Review of Systems  Constitutional:  Positive for fatigue. Negative for chills, diaphoresis and fever.  HENT:  Positive for congestion, sinus pressure and sore throat. Negative for ear pain and rhinorrhea.   Eyes:  Positive for pain, discharge and redness. Negative for photophobia and visual disturbance.  Respiratory:  Negative for cough and shortness of breath.   Cardiovascular:  Negative for chest pain.  Gastrointestinal:  Negative for abdominal pain, nausea and vomiting.  Musculoskeletal:  Negative for arthralgias and myalgias.  Skin:  Negative for rash.  Neurological:  Positive for headaches. Negative for weakness.  Hematological:  Positive for adenopathy.    Physical Exam Triage Vital Signs ED Triage Vitals  Enc Vitals Group     BP 10/24/20 1530 127/90     Pulse Rate 10/24/20 1530 (!) 110     Resp 10/24/20 1530 14     Temp 10/24/20 1530 99.1 F (37.3 C)     Temp Source 10/24/20 1530 Oral     SpO2 10/24/20 1530 98 %     Weight 10/24/20 1528 130 lb (59 kg)     Height 10/24/20 1528 5\' 5"  (1.651 m)     Head Circumference --      Peak Flow --      Pain Score 10/24/20 1527 5     Pain Loc --      Pain Edu? --      Excl. in GC? --    No data  found.  Updated Vital Signs BP 127/90 (BP Location: Left Arm)   Pulse (!) 110   Temp 99.1 F (37.3 C) (Oral)   Resp 14   Ht 5\' 5"  (1.651 m)   Wt 130 lb (59 kg)   LMP 09/29/2020   SpO2 98%   BMI 21.63 kg/m       Physical Exam Vitals and nursing note reviewed.  Constitutional:      General: She is not in acute distress.    Appearance: Normal appearance. She is not ill-appearing or toxic-appearing.  HENT:     Head: Normocephalic and atraumatic.     Nose: Congestion present.     Mouth/Throat:     Mouth: Mucous membranes are moist.     Pharynx: Oropharynx is clear. Posterior oropharyngeal erythema present.     Tonsils: Tonsillar exudate (thick white exudates bilateral tonsils) present. 2+ on the right. 2+ on the left.  Eyes:     General: No scleral icterus.       Right eye: Discharge (light yellow drainage) present.        Left eye: Discharge (clear watery drainage) present.    Extraocular Movements: Extraocular movements intact.     Conjunctiva/sclera:     Right eye: Right conjunctiva is injected.     Left eye: Left conjunctiva is injected.     Pupils: Pupils are equal, round, and reactive to light.     Comments: Moderate swelling and erythema w/ mild TTP right upper eyelid.   Cardiovascular:     Rate and Rhythm: Normal rate and regular rhythm.     Heart sounds: Normal heart sounds.  Pulmonary:     Effort: Pulmonary effort is normal. No respiratory distress.     Breath sounds: Normal breath sounds.  Musculoskeletal:     Cervical back: Neck supple.  Lymphadenopathy:     Cervical: Cervical adenopathy (tender and enlarged ant cervical nodes) present.  Skin:    General: Skin is dry.  Neurological:     General: No focal deficit present.     Mental Status: She is alert. Mental status is at baseline.     Motor: No weakness.     Gait: Gait normal.  Psychiatric:        Mood and Affect: Mood normal.        Behavior: Behavior normal.        Thought Content: Thought content  normal.     UC Treatments / Results  Labs (all labs ordered are listed, but only abnormal results are displayed) Labs Reviewed  CULTURE, GROUP A  STREP [facility])  POCT RAPID STREP A, ED / UC  POCT RAPID STREP A    EKG   Radiology No results found.  Procedures Procedures (including critical care time)  Medications Ordered in UC Medications - No data to display  Initial Impression / Assessment and Plan / UC Course  I have reviewed the triage vital signs and the nursing notes.  Pertinent labs & imaging results that were available during my care of the patient were reviewed by me and considered in my medical decision making (see chart for details).  45 year old female presenting with sore throat, swollen tonsils, swollen lymph nodes, bilateral eye redness, drainage and pain.  On exam she has significant exudative tonsillitis and bilateral conjunctivitis which is worse on the right and also has a swollen and erythematous right upper eyelid.  Mild tenderness to palpation but no pain with moving the eye.  Rapid strep is negative.  Culture sent.  Suspect patient does have strep given her exudative tonsillitis, enlarged cervical nodes and significant fatigue.  Treating her at this time with Augmentin.  Also sent prednisone to help decrease the swelling of her tonsils and viscous lidocaine for throat pain.  Additionally I have changed her eyedrop to Ciloxan.  Encouraged her to increase rest and fluids.  Follow-up with PCP if not improving.  ED precautions reviewed.   Final Clinical Impressions(s) / UC Diagnoses   Final diagnoses:  Exudative tonsillitis  Sore throat  Acute bacterial conjunctivitis of both eyes     Discharge Instructions      Your strep test is negative but I have low confidence in that result given the way that your throat looks.  Will be sent for culture but I am going to go ahead and treat with Augmentin.  Additionally you have conjunctivitis that has not improved  with the eyedrops that you have been on previously so I have sent a new 1.  I have also sent prednisone to help with the swelling of your eye and your tonsils.  Additionally I sent viscous lidocaine to help numb your throat.  Increase rest and fluids.  You can take Tylenol for pain if needed.  Please follow-up PCP if not improving or return here.  Please go to ED for any severe acute worsening of any of your symptoms.     ED Prescriptions     Medication Sig Dispense Auth. Provider   amoxicillin-clavulanate (AUGMENTIN) 875-125 MG tablet Take 1 tablet by mouth every 12 (twelve) hours for 10 days. 20 tablet Eusebio Friendly B, PA-C   ciprofloxacin (CILOXAN) 0.3 % ophthalmic solution Administer 1 drop, every 2 hours, while awake, for 2 days. Then 1 drop, every 4 hours, while awake, for the next 5 days. 5 mL Eusebio Friendly B, PA-C   predniSONE (DELTASONE) 20 MG tablet Take 2 tablets (40 mg total) by mouth daily for 5 days. 10 tablet Eusebio Friendly B, PA-C   lidocaine (XYLOCAINE) 2 % solution Use as directed 15 mLs in the mouth or throat every 3 (three) hours as needed for mouth pain (swish and spit). 100 mL Shirlee Latch, PA-C      PDMP not reviewed this encounter.   Shirlee Latch, PA-C 10/24/20 [phone]

## 2020-10-26 LAB — CULTURE, GROUP A STREP (THRC)

## 2021-06-14 ENCOUNTER — Other Ambulatory Visit: Payer: Self-pay | Admitting: Family Medicine

## 2021-06-14 DIAGNOSIS — Z1231 Encounter for screening mammogram for malignant neoplasm of breast: Secondary | ICD-10-CM

## 2021-07-21 ENCOUNTER — Ambulatory Visit
Admission: RE | Admit: 2021-07-21 | Discharge: 2021-07-21 | Disposition: A | Discharge status: Home / Self Care | Payer: BC Managed Care – PPO | Source: Ambulatory Visit | Attending: Family Medicine | Admitting: Family Medicine

## 2021-07-21 DIAGNOSIS — Z1231 Encounter for screening mammogram for malignant neoplasm of breast: Secondary | ICD-10-CM | POA: Diagnosis present

## 2022-04-20 ENCOUNTER — Other Ambulatory Visit: Payer: Self-pay | Admitting: Family Medicine

## 2022-04-20 DIAGNOSIS — Z1231 Encounter for screening mammogram for malignant neoplasm of breast: Secondary | ICD-10-CM

## 2022-07-07 ENCOUNTER — Ambulatory Visit
Admission: RE | Admit: 2022-07-07 | Discharge: 2022-07-07 | Disposition: A | Discharge status: Home / Self Care | Payer: BC Managed Care – PPO | Source: Ambulatory Visit | Attending: Family Medicine | Admitting: Family Medicine

## 2022-07-07 VITALS — BP 116/75 | HR 72 | Temp 97.9°F | Resp 14 | Ht 65.0 in | Wt 132.0 lb

## 2022-07-07 DIAGNOSIS — N3000 Acute cystitis without hematuria: Secondary | ICD-10-CM | POA: Diagnosis not present

## 2022-07-07 LAB — URINALYSIS, W/ REFLEX TO CULTURE (INFECTION SUSPECTED)
Bilirubin Urine: NEGATIVE
Glucose, UA: NEGATIVE mg/dL
Ketones, ur: NEGATIVE mg/dL
Nitrite: NEGATIVE
Protein, ur: NEGATIVE mg/dL
Specific Gravity, Urine: 1.01 (ref 1.005–1.030)
pH: 7 (ref 5.0–8.0)

## 2022-07-07 MED ORDER — CEPHALEXIN 500 MG PO CAPS: 500.0000 mg | ORAL_CAPSULE | Freq: Four times a day (QID) | ORAL | 0 refills | Status: AC

## 2022-07-07 NOTE — Discharge Instructions (Signed)
You had evidence of of a bacterial infection in your urine today.  Stop by the pharmacy to pick up your prescriptions.  For your UTI: Take Keflex 4 times a day for the next 5 days  If your symptoms do not improve in the next 7 days, be sure to follow-up here or at your primary care provider office.  Go to the emergency department if you are having increasing pain, worsening vaginal bleeding or fever.  

## 2022-07-07 NOTE — ED Triage Notes (Signed)
Patient c/o dysuria and urinary frequency for the past 3-4 days ago.  Patient recently traveled to [place].

## 2022-07-07 NOTE — ED Provider Notes (Signed)
[facility]    CSN: [id number] Arrival date & time: 07/07/22  0800      History   Chief Complaint Chief Complaint  Patient presents with   Urinary Frequency   Dysuria     HPI HPI Cheryl Simmons is a 47 y.o. female.    Cheryl Simmons presents for dysuria, urinary frequency and urinary urgency that started when [place] was [place].  Tried increasing her fluid intake prior to arrival.  Has not had any antibiotics in last 30 days.   Denies known STI exposure.  She is  not currently pregnant.  Patient's last menstrual period was 06/29/2022 (approximate).  - Abnormal vaginal discharge: no  - vaginal bleeding: no - Dysuria: yes - Hematuria: no - Urinary urgency: yes - Urinary frequency: yes  - Fever: no - Chills: yes  - Abdominal pain : pressure  - Pelvic pain: no - Rash/Skin lesions/mouth ulcers: no - Nausea: no  - Vomiting: no  - Back Pain: no        History reviewed. No pertinent past medical history.  There are no problems to display for this patient.   Past Surgical History:  Procedure Laterality Date   BREAST BIOPSY Left 06/12/2017   bx/clip-neg    OB History   No obstetric history on file.      Home Medications    Prior to Admission medications   Medication Sig Start Date End Date Taking? Authorizing Provider  cephALEXin (KEFLEX) 500 MG capsule Take 1 capsule (500 mg total) by mouth 4 (four) times daily. 07/07/22  Yes Alexsis Kathman, DO  lidocaine (XYLOCAINE) 2 % solution Use as directed 15 mLs in the mouth or throat every 3 (three) hours as needed for mouth pain (swish and spit). 10/24/20   Shirlee Latch, PA-C    Family History Family History  Problem Relation Age of Onset   Breast cancer Paternal Grandmother 69    Social History Social History   Tobacco Use   Smoking status: Never   Smokeless tobacco: Never  Vaping Use   Vaping Use: Never used  Substance Use Topics   Alcohol use: Yes     Comment: socially   Drug use: Never     Allergies   Hydrocortisone acetate   Review of Systems Review of Systems: :negative unless otherwise stated in HPI.      Physical Exam Triage Vital Signs ED Triage Vitals  Enc Vitals Group     BP 07/07/22 0818 116/75     Pulse Rate 07/07/22 0818 72     Resp 07/07/22 0818 14     Temp 07/07/22 0818 97.9 F (36.6 C)     Temp Source 07/07/22 0818 Oral     SpO2 07/07/22 0818 99 %     Weight 07/07/22 0816 132 lb (59.9 kg)     Height 07/07/22 0816 5\' 5"  (1.651 m)     Head Circumference --      Peak Flow --      Pain Score 07/07/22 0816 4     Pain Loc --      Pain Edu? --      Excl. in GC? --    No data found.  Updated Vital Signs BP 116/75 (BP Location: Left Arm)   Pulse 72   Temp 97.9 F (36.6 C) (Oral)   Resp 14   Ht 5\' 5"  (1.651 m)   Wt 59.9 kg   LMP 06/29/2022 (Approximate)   SpO2 99%  BMI 21.97 kg/m   Visual Acuity Right Eye Distance:   Left Eye Distance:   Bilateral Distance:    Right Eye Near:   Left Eye Near:    Bilateral Near:     Physical Exam GEN: well appearing female in no acute distress  CVS: well perfused, regular rate  RESP: speaking in full sentences without pause     UC Treatments / Results  Labs (all labs ordered are listed, but only abnormal results are displayed) Labs Reviewed  URINALYSIS, W/ REFLEX TO CULTURE (INFECTION SUSPECTED) - Abnormal; Notable for the following components:      Result Value   Hgb urine dipstick MODERATE (*)    Leukocytes,Ua SMALL (*)    Bacteria, UA RARE (*)    All other components within normal limits    EKG   Radiology No results found.  Procedures Procedures (including critical care time)  Medications Ordered in UC Medications - No data to display  Initial Impression / Assessment and Plan / UC Course  I have reviewed the triage vital signs and the nursing notes.  Pertinent labs & imaging results that were available during my care of the patient  were reviewed by me and considered in my medical decision making (see chart for details).     Acute cystitis:  Patient is a 47 y.o. female  who presents for 4 days of \\dysuria  and urinary urgency with frequency.  Overall patient is well-appearing and afebrile.  Vital signs stable.  UA consistent with acute cystitis.  Hematuria not supported on microscopy.  Treat with Keflex 4 times daily for 5 days. Return precautions including abdominal pain, fever, chills, nausea, or vomiting given.   Discussed MDM, treatment plan and plan for follow-up with patient who agrees with plan.        Final Clinical Impressions(s) / UC Diagnoses   Final diagnoses:  Acute cystitis without hematuria     Discharge Instructions      You had evidence of of a bacterial infection in your urine today.  Stop by the pharmacy to pick up your prescriptions.  For your UTI: Take Keflex 4 times a day for the next 5 days  If your symptoms do not improve in the next 7 days, be sure to follow-up here or at your primary care provider office.  Go to the emergency department if you are having increasing pain, worsening vaginal bleeding or fever.      ED Prescriptions     Medication Sig Dispense Auth. Provider   cephALEXin (KEFLEX) 500 MG capsule Take 1 capsule (500 mg total) by mouth 4 (four) times daily. 20 capsule Katha CabalBrimage, Deah Ottaway, DO      PDMP not reviewed this encounter.   Katha CabalBrimage, Fedrick Cefalu, DO 07/07/22 [phone]

## 2022-07-24 ENCOUNTER — Ambulatory Visit
Admission: RE | Admit: 2022-07-24 | Discharge: 2022-07-24 | Disposition: A | Discharge status: Home / Self Care | Payer: BC Managed Care – PPO | Source: Ambulatory Visit | Attending: Family Medicine | Admitting: Family Medicine

## 2022-07-24 DIAGNOSIS — Z1231 Encounter for screening mammogram for malignant neoplasm of breast: Secondary | ICD-10-CM | POA: Diagnosis present

## 2022-09-05 ENCOUNTER — Ambulatory Visit
Admission: RE | Admit: 2022-09-05 | Discharge: 2022-09-05 | Disposition: A | Discharge status: Home / Self Care | Payer: BC Managed Care – PPO | Source: Ambulatory Visit | Attending: Physician Assistant | Admitting: Physician Assistant

## 2022-09-05 VITALS — BP 113/72 | HR 91 | Temp 97.8°F | Resp 18

## 2022-09-05 DIAGNOSIS — B9789 Other viral agents as the cause of diseases classified elsewhere: Secondary | ICD-10-CM

## 2022-09-05 DIAGNOSIS — J329 Chronic sinusitis, unspecified: Secondary | ICD-10-CM | POA: Diagnosis not present

## 2022-09-05 DIAGNOSIS — R051 Acute cough: Secondary | ICD-10-CM

## 2022-09-05 DIAGNOSIS — R0981 Nasal congestion: Secondary | ICD-10-CM | POA: Diagnosis not present

## 2022-09-05 LAB — SARS CORONAVIRUS 2 BY RT PCR: SARS Coronavirus 2 by RT PCR: NEGATIVE

## 2022-09-05 MED ORDER — IPRATROPIUM BROMIDE 0.06 % NA SOLN: 2.0000 | Freq: Four times a day (QID) | NASAL | 0 refills | Status: AC

## 2022-09-05 NOTE — Discharge Instructions (Signed)
-  Negative COVID test  URI/COLD SYMPTOMS: Your exam today is consistent with a viral illness. Antibiotics are not indicated at this time. Use medications as directed, including cough syrup, nasal saline, and decongestants. I would suggest Mucinex D. I sent a nasal spray for you. Your symptoms should improve over the next few days and resolve within 7-10 days. Increase rest and fluids. F/u if symptoms worsen or predominate such as sore throat, ear pain, productive cough, shortness of breath, or if you develop high fevers or worsening fatigue over the next several days.

## 2022-09-05 NOTE — ED Provider Notes (Signed)
[facility]    CSN: [id number] Arrival date & time: 09/05/22  1323      History   Chief Complaint Chief Complaint  Patient presents with   Nasal Congestion    I have a cough and significant sinus pain. - Entered by patient   Cough   Facial Pain    HPI Cheryl Simmons is a 47 y.o. female presenting for cough, congestion, fatigue, sinus pressure and scratchy throat as well as bilateral ear pain/pressure for the past 4 days.  She denies fever but says she has felt hot.  Denies body aches, chest pain, shortness of breath, vomiting or diarrhea.  Has taken Sudafed over-the-counter once and ibuprofen without relief.  Denies any sick contacts.  HPI  History reviewed. No pertinent past medical history.  There are no problems to display for this patient.   Past Surgical History:  Procedure Laterality Date   BREAST BIOPSY Left 06/12/2017   bx/clip-neg    OB History   No obstetric history on file.      Home Medications    Prior to Admission medications   Medication Sig Start Date End Date Taking? Authorizing Provider  ipratropium (ATROVENT) 0.06 % nasal spray Place 2 sprays into both nostrils 4 (four) times daily. 09/05/22  Yes Shirlee Latch, PA-C  cephALEXin (KEFLEX) 500 MG capsule Take 1 capsule (500 mg total) by mouth 4 (four) times daily. 07/07/22   Brimage, Seward Meth, DO  lidocaine (XYLOCAINE) 2 % solution Use as directed 15 mLs in the mouth or throat every 3 (three) hours as needed for mouth pain (swish and spit). 10/24/20   Shirlee Latch, PA-C    Family History Family History  Problem Relation Age of Onset   Breast cancer Paternal Grandmother 33    Social History Social History   Tobacco Use   Smoking status: Never   Smokeless tobacco: Never  Vaping Use   Vaping Use: Never used  Substance Use Topics   Alcohol use: Yes    Comment: socially   Drug use: Never     Allergies   Hydrocortisone acetate   Review of Systems Review of Systems   Constitutional:  Positive for fatigue. Negative for chills, diaphoresis and fever.  HENT:  Positive for congestion, ear pain, rhinorrhea, sinus pressure and sore throat.   Respiratory:  Positive for cough. Negative for shortness of breath.   Gastrointestinal:  Negative for abdominal pain, nausea and vomiting.  Musculoskeletal:  Negative for myalgias.  Skin:  Negative for rash.  Neurological:  Negative for weakness and headaches.  Hematological:  Negative for adenopathy.     Physical Exam Triage Vital Signs ED Triage Vitals  Enc Vitals Group     BP      Pulse      Resp      Temp      Temp src      SpO2      Weight      Height      Head Circumference      Peak Flow      Pain Score      Pain Loc      Pain Edu?      Excl. in GC?    No data found.  Updated Vital Signs BP 113/72 (BP Location: Left Arm)   Pulse 91   Temp 97.8 F (36.6 C) (Temporal)   Resp 18   LMP 08/22/2022 (Approximate)   SpO2 98%  Physical Exam Vitals and nursing note reviewed.  Constitutional:      General: She is not in acute distress.    Appearance: Normal appearance. She is not ill-appearing or toxic-appearing.  HENT:     Head: Normocephalic and atraumatic.     Right Ear: Ear canal and external ear normal. A middle ear effusion is present.     Left Ear: Ear canal and external ear normal. A middle ear effusion is present.     Nose: Congestion present.     Mouth/Throat:     Mouth: Mucous membranes are moist.     Pharynx: Oropharynx is clear. Posterior oropharyngeal erythema present.  Eyes:     General: No scleral icterus.       Right eye: No discharge.        Left eye: No discharge.     Conjunctiva/sclera: Conjunctivae normal.  Cardiovascular:     Rate and Rhythm: Normal rate and regular rhythm.     Heart sounds: Normal heart sounds.  Pulmonary:     Effort: Pulmonary effort is normal. No respiratory distress.     Breath sounds: Normal breath sounds.  Musculoskeletal:      Cervical back: Neck supple.  Skin:    General: Skin is dry.  Neurological:     General: No focal deficit present.     Mental Status: She is alert. Mental status is at baseline.     Motor: No weakness.     Gait: Gait normal.  Psychiatric:        Mood and Affect: Mood normal.        Behavior: Behavior normal.        Thought Content: Thought content normal.      UC Treatments / Results  Labs (all labs ordered are listed, but only abnormal results are displayed) Labs Reviewed  SARS CORONAVIRUS 2 BY RT PCR    EKG   Radiology No results found.  Procedures Procedures (including critical care time)  Medications Ordered in UC Medications - No data to display  Initial Impression / Assessment and Plan / UC Course  I have reviewed the triage vital signs and the nursing notes.  Pertinent labs & imaging results that were available during my care of the patient were reviewed by me and considered in my medical decision making (see chart for details).   47 year old female presents for 4-day history of congestion, cough, sinus pressure, ear pressure and fatigue.  Feels that symptoms are getting worse and not better.  Denies fever.  Vitals are all normal and stable and she is overall well-appearing.  No acute distress.  On exam she has clear effusion of bilateral Tms, moderate to significant nasal congestion without drainage, erythema posterior pharynx with clear postnasal drainage.  Chest consultation.  PCR COVID is obtained.  Reviewed test results with patient.  Supportive care advised.  Advised her to take over-the-counter Mucinex D.  Sent Atrovent nasal spray to pharmacy.  Explained that most viruses get better within 7 to 10 days.  She should return if she is not feeling better after 10 days or she develops a fever or worsening symptoms.   Final Clinical Impressions(s) / UC Diagnoses   Final diagnoses:  Viral sinusitis  Acute cough  Nasal congestion     Discharge  Instructions      -Negative COVID test  URI/COLD SYMPTOMS: Your exam today is consistent with a viral illness. Antibiotics are not indicated at this time. Use medications as directed, including cough syrup,  nasal saline, and decongestants. I would suggest Mucinex D. I sent a nasal spray for you. Your symptoms should improve over the next few days and resolve within 7-10 days. Increase rest and fluids. F/u if symptoms worsen or predominate such as sore throat, ear pain, productive cough, shortness of breath, or if you develop high fevers or worsening fatigue over the next several days.       ED Prescriptions     Medication Sig Dispense Auth. Provider   ipratropium (ATROVENT) 0.06 % nasal spray Place 2 sprays into both nostrils 4 (four) times daily. 15 mL Shirlee Latch, PA-C      PDMP not reviewed this encounter.   Shirlee Latch, PA-C 09/05/22 [phone]

## 2022-09-05 NOTE — ED Triage Notes (Signed)
Pt states she has had cough, congestion and sinus pressure since Friday. She took some sudafed and allergy meds without any relief.

## 2023-07-23 ENCOUNTER — Other Ambulatory Visit: Payer: Self-pay | Admitting: Family Medicine

## 2023-07-23 DIAGNOSIS — Z1231 Encounter for screening mammogram for malignant neoplasm of breast: Secondary | ICD-10-CM

## 2023-08-01 ENCOUNTER — Ambulatory Visit
Admission: RE | Admit: 2023-08-01 | Discharge: 2023-08-01 | Disposition: A | Discharge status: Home / Self Care | Payer: Self-pay | Source: Ambulatory Visit | Attending: Family Medicine | Admitting: Family Medicine

## 2023-08-01 DIAGNOSIS — Z1231 Encounter for screening mammogram for malignant neoplasm of breast: Secondary | ICD-10-CM | POA: Insufficient documentation
